# Patient Record
Sex: Female | Born: 1940 | Race: White | Hispanic: No | Smoking: Never smoker
Health system: Southern US, Community
[De-identification: ages and names within clinical notes are randomized; demographics above are authoritative.]

## PROBLEM LIST (undated history)

## (undated) DIAGNOSIS — E079 Disorder of thyroid, unspecified: Secondary | ICD-10-CM

---

## 2017-07-25 ENCOUNTER — Other Ambulatory Visit: Payer: Self-pay | Admitting: Family Medicine

## 2017-07-25 DIAGNOSIS — Z780 Asymptomatic menopausal state: Secondary | ICD-10-CM

## 2017-08-16 ENCOUNTER — Encounter: Payer: Self-pay | Admitting: Radiology

## 2017-08-16 ENCOUNTER — Ambulatory Visit
Admission: RE | Admit: 2017-08-16 | Discharge: 2017-08-16 | Disposition: A | Discharge status: Home / Self Care | Payer: Medicare PPO | Source: Ambulatory Visit | Attending: Family Medicine | Admitting: Family Medicine

## 2017-08-16 DIAGNOSIS — Z780 Asymptomatic menopausal state: Secondary | ICD-10-CM | POA: Insufficient documentation

## 2017-08-16 DIAGNOSIS — E039 Hypothyroidism, unspecified: Secondary | ICD-10-CM | POA: Insufficient documentation

## 2017-08-16 DIAGNOSIS — M8588 Other specified disorders of bone density and structure, other site: Secondary | ICD-10-CM | POA: Insufficient documentation

## 2018-05-31 ENCOUNTER — Other Ambulatory Visit: Payer: Self-pay | Admitting: Family Medicine

## 2018-05-31 DIAGNOSIS — E039 Hypothyroidism, unspecified: Secondary | ICD-10-CM

## 2018-06-11 ENCOUNTER — Ambulatory Visit
Admission: RE | Admit: 2018-06-11 | Discharge: 2018-06-11 | Disposition: A | Discharge status: Home / Self Care | Payer: Medicare PPO | Source: Ambulatory Visit | Attending: Family Medicine | Admitting: Family Medicine

## 2018-06-11 DIAGNOSIS — E039 Hypothyroidism, unspecified: Secondary | ICD-10-CM | POA: Diagnosis present

## 2018-06-11 DIAGNOSIS — E034 Atrophy of thyroid (acquired): Secondary | ICD-10-CM | POA: Diagnosis not present

## 2019-02-09 ENCOUNTER — Encounter: Payer: Self-pay | Admitting: Emergency Medicine

## 2019-02-09 ENCOUNTER — Other Ambulatory Visit: Payer: Self-pay

## 2019-02-09 ENCOUNTER — Ambulatory Visit (INDEPENDENT_AMBULATORY_CARE_PROVIDER_SITE_OTHER): Payer: Medicare PPO

## 2019-02-09 ENCOUNTER — Ambulatory Visit
Admission: EM | Admit: 2019-02-09 | Discharge: 2019-02-09 | Disposition: A | Discharge status: Home / Self Care | Payer: Medicare PPO | Attending: Family Medicine | Admitting: Family Medicine

## 2019-02-09 DIAGNOSIS — S82839A Other fracture of upper and lower end of unspecified fibula, initial encounter for closed fracture: Secondary | ICD-10-CM

## 2019-02-09 DIAGNOSIS — W19XXXA Unspecified fall, initial encounter: Secondary | ICD-10-CM | POA: Diagnosis not present

## 2019-02-09 DIAGNOSIS — M25571 Pain in right ankle and joints of right foot: Secondary | ICD-10-CM | POA: Diagnosis not present

## 2019-02-09 HISTORY — DX: Disorder of thyroid, unspecified: E07.9

## 2019-02-09 MED ORDER — HYDROCODONE-ACETAMINOPHEN 5-325 MG PO TABS: ORAL_TABLET | ORAL | 0 refills | Status: AC

## 2019-02-09 NOTE — Discharge Instructions (Signed)
Follow up with orthopedist next week °

## 2019-02-09 NOTE — ED Triage Notes (Signed)
Patient c/o right ankle pain after she twisted her ankle yesterday.

## 2019-02-09 NOTE — ED Provider Notes (Signed)
[facility]    CSN: [id number] Arrival date & time: 02/09/19  6301     History   Chief Complaint Chief Complaint  Patient presents with  . Ankle Pain    right    HPI Denise Jacobson is a 78 y.o. female.   78 yo female with a c/o right ankle pain and swelling after twisting ankle and falling yesterday. States has been able to bear weight but it is very painful.   The history is provided by the patient.  Ankle Pain    Past Medical History:  Diagnosis Date  . Thyroid disease     There are no active problems to display for this patient.   History reviewed. No pertinent surgical history.  OB History   No obstetric history on file.      Home Medications    Prior to Admission medications   Medication Sig Start Date End Date Taking? Authorizing Provider  HYDROcodone-acetaminophen (NORCO/VICODIN) 5-325 MG tablet 1-2 tabs po qd prn 02/09/19   Payton Mccallum, MD  levothyroxine (SYNTHROID, LEVOTHROID) 75 MCG tablet  01/13/19   [provider]    Family History History reviewed. No pertinent family history.  Social History Social History   Tobacco Use  . Smoking status: Never Smoker  . Smokeless tobacco: Never Used  Substance Use Topics  . Alcohol use: Not Currently  . Drug use: Never     Allergies   Patient has no known allergies.   Review of Systems Review of Systems   Physical Exam Triage Vital Signs ED Triage Vitals  Enc Vitals Group     BP 02/09/19 0911 (!) 149/71     Pulse Rate 02/09/19 0911 89     Resp 02/09/19 0911 14     Temp 02/09/19 0911 98.2 F (36.8 C)     Temp Source 02/09/19 0911 Oral     SpO2 02/09/19 0911 99 %     Weight 02/09/19 0909 130 lb (59 kg)     Height 02/09/19 0909 5' (1.524 m)     Head Circumference --      Peak Flow --      Pain Score 02/09/19 0909 8     Pain Loc --      Pain Edu? --      Excl. in GC? --    No data found.  Updated Vital Signs BP (!) 149/71 (BP Location: Left Arm)    Pulse 89   Temp 98.2 F (36.8 C) (Oral)   Resp 14   Ht 5' (1.524 m)   Wt 59 kg   SpO2 99%   BMI 25.39 kg/m   Visual Acuity Right Eye Distance:   Left Eye Distance:   Bilateral Distance:    Right Eye Near:   Left Eye Near:    Bilateral Near:     Physical Exam Vitals signs and nursing note reviewed.  Constitutional:      General: She is not in acute distress.    Appearance: She is not ill-appearing or toxic-appearing.  Musculoskeletal:     Left ankle: She exhibits decreased range of motion and swelling. She exhibits no ecchymosis, no deformity, no laceration and normal pulse. Tenderness. Lateral malleolus and AITFL tenderness found. No medial malleolus, no CF ligament, no posterior TFL, no head of 5th metatarsal and no proximal fibula tenderness found. Achilles tendon normal.  Neurological:     Mental Status: She is alert.      UC Treatments / Results  Labs (all labs ordered are listed, but only abnormal results are displayed) Labs Reviewed - No data to display  EKG None  Radiology Dg Ankle Complete Right  Result Date: 02/09/2019 CLINICAL DATA:  C/O pain right ankle after falling and twisting ankle last night. EXAM: RIGHT ANKLE - COMPLETE 3+ VIEW COMPARISON:  None. FINDINGS: Sliver of bone lies between the distal fibula and the adjacent talus consistent with an acute talar avulsion fracture. Well-corticated bone fragments lie just inferior to the tip of the medial malleolus. These are chronic in appearance consistent with accessory ossification centers or old avulsion fractures. No other evidence of a fracture. Ankle joint is normally spaced and aligned. There is soft tissue swelling that predominates laterally. IMPRESSION: 1. Lateral talar avulsion fracture likely at the insertion of the anterior talofibular ligament. 2. No other acute fracture.  Normally aligned ankle joint. Electronically Signed   By: Amie Portland M.D.   On: 02/09/2019 09:54    Procedures Procedures  (including critical care time)  Medications Ordered in UC Medications - No data to display  Initial Impression / Assessment and Plan / UC Course  I have reviewed the triage vital signs and the nursing notes.  Pertinent labs & imaging results that were available during my care of the patient were reviewed by me and considered in my medical decision making (see chart for details).      Final Clinical Impressions(s) / UC Diagnoses   Final diagnoses:  Avulsion fracture of distal fibula    ED Prescriptions    Medication Sig Dispense Auth. Provider   HYDROcodone-acetaminophen (NORCO/VICODIN) 5-325 MG tablet 1-2 tabs po qd prn 6 tablet Payton Mccallum, MD     1. x-ray results and diagnosis reviewed with patient; cast boot placed for immobilization 2. rx as per orders above; reviewed possible side effects, interactions, risks and benefits  3. Recommend supportive treatment with otc analgesics as needed; elevate 4. Follow-up with orthopedist next week   Controlled Substance Prescriptions [place] Controlled Substance Registry consulted? Not Applicable   Payton Mccallum, MD 02/09/19 1034

## 2020-12-03 IMAGING — CR DG ANKLE COMPLETE 3+V*R*
3 series · 3 of 3 positions shown · non-contrast
Comparison: None.

CLINICAL DATA: C/O pain right ankle after falling and twisting
ankle last night.

EXAM:
RIGHT ANKLE - COMPLETE 3+ VIEW

[ankle ap]
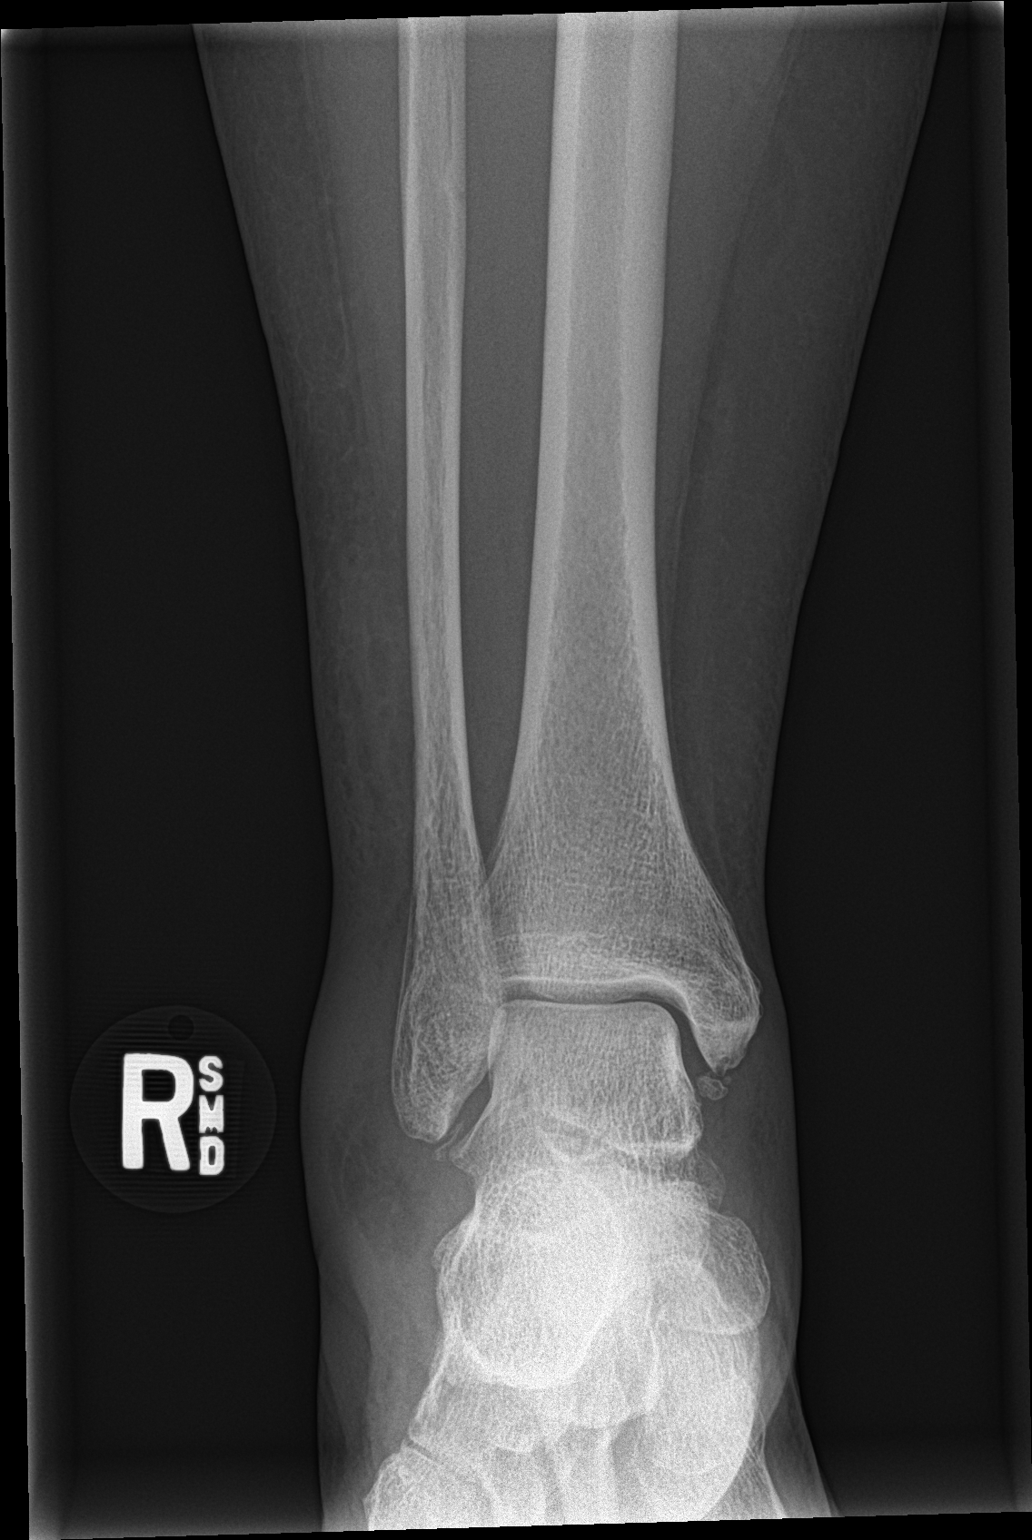

[ankle obl]
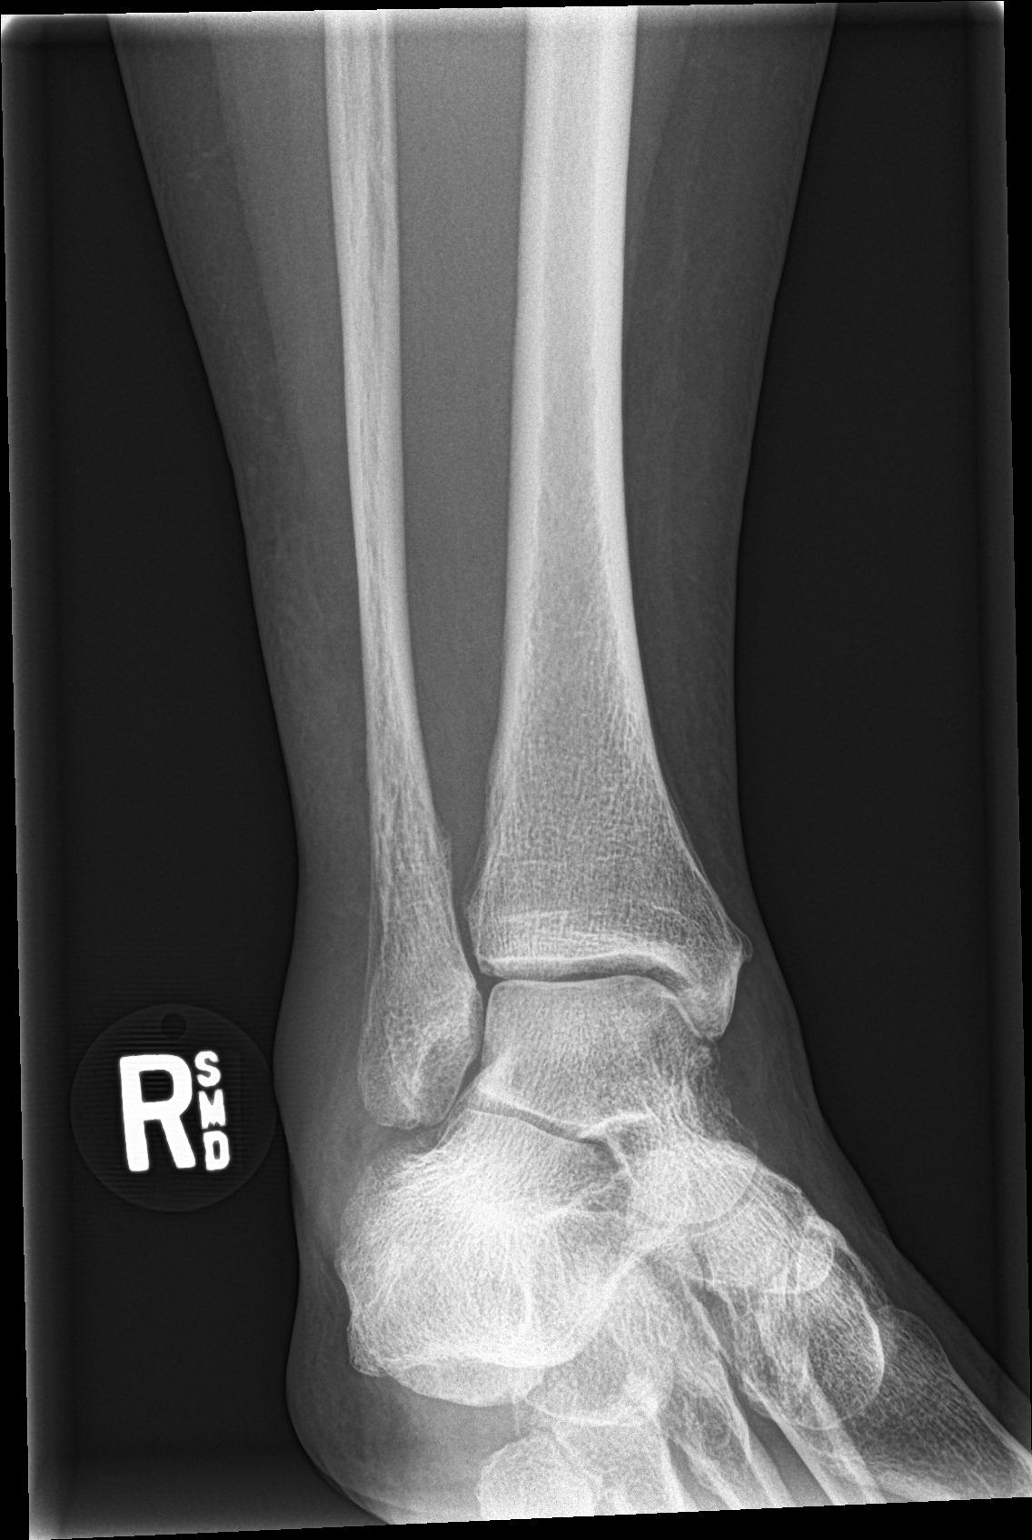

[ankle lat]
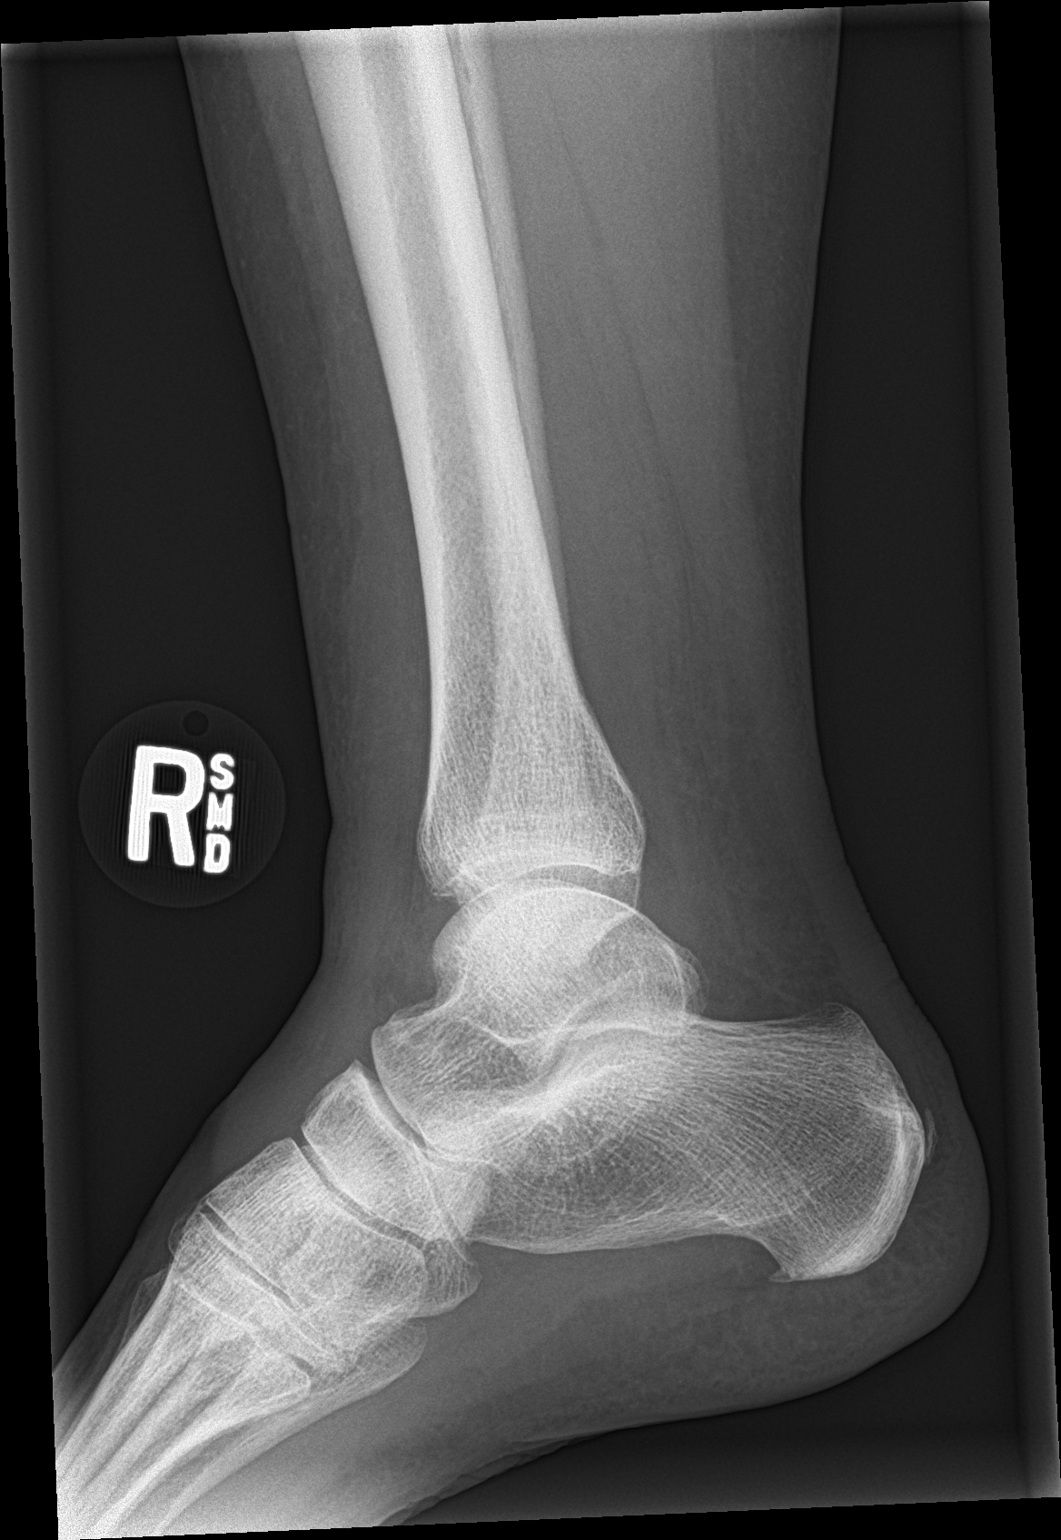

[3 of 3 positions shown; findings below may reference images not displayed]

FINDINGS: Sliver of bone lies between the distal fibula and the adjacent talus
consistent with an acute talar avulsion fracture.

Well-corticated bone fragments lie just inferior to the tip of the
medial malleolus. These are chronic in appearance consistent with
accessory ossification centers or old avulsion fractures.

No other evidence of a fracture. Ankle joint is normally spaced and
aligned.

There is soft tissue swelling that predominates laterally.
IMPRESSION: 1. Lateral talar avulsion fracture likely at the insertion of the
anterior talofibular ligament.
2. No other acute fracture.  Normally aligned ankle joint.
